# Patient Record
Sex: Male | Born: 1990 | Race: White | Hispanic: No | Marital: Single | Smoking: Current every day smoker
Health system: Southern US, Community
[De-identification: ages and names within clinical notes are randomized; demographics above are authoritative.]

## PROBLEM LIST (undated history)

## (undated) DIAGNOSIS — F419 Anxiety disorder, unspecified: Secondary | ICD-10-CM

## (undated) DIAGNOSIS — Z8659 Personal history of other mental and behavioral disorders: Principal | ICD-10-CM

## (undated) HISTORY — DX: Personal history of other mental and behavioral disorders: Z86.59

## (undated) HISTORY — DX: Anxiety disorder, unspecified: F41.9

## (undated) HISTORY — PX: TONSILLECTOMY: SUR1361

---
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
## 2012-06-15 ENCOUNTER — Other Ambulatory Visit: Payer: Self-pay

## 2012-06-15 MED ORDER — LISDEXAMFETAMINE DIMESYLATE 40 MG PO CAPS: 40.0000 mg | ORAL_CAPSULE | ORAL | Status: AC

## 2012-06-15 NOTE — Telephone Encounter (Signed)
Refill request for Vyvanse 40 mg. When it is ready mom wants a call so [facility] can pick it up.

## 2012-07-06 ENCOUNTER — Other Ambulatory Visit: Payer: Self-pay | Admitting: *Deleted

## 2012-07-06 MED ORDER — CLONIDINE HCL 0.1 MG PO TABS: 0.1000 mg | ORAL_TABLET | Freq: Every day | ORAL | Status: DC

## 2012-07-06 NOTE — Telephone Encounter (Signed)
Pt needs an office visit before any more refills will be given.

## 2012-07-17 ENCOUNTER — Other Ambulatory Visit: Payer: Self-pay | Admitting: *Deleted

## 2012-07-17 NOTE — Telephone Encounter (Signed)
Patient needs an appt before any refills can be given.  He was last seen 02/09/2012,

## 2012-07-25 ENCOUNTER — Ambulatory Visit (INDEPENDENT_AMBULATORY_CARE_PROVIDER_SITE_OTHER): Payer: Self-pay | Admitting: Pediatrics

## 2012-07-25 ENCOUNTER — Encounter: Payer: Self-pay | Admitting: Pediatrics

## 2012-07-25 VITALS — BP 118/68 | Temp 99.0°F | Wt 136.2 lb

## 2012-07-25 DIAGNOSIS — F909 Attention-deficit hyperactivity disorder, unspecified type: Secondary | ICD-10-CM

## 2012-07-25 NOTE — Progress Notes (Deleted)
Subjective:     Patient ID: Henry Reynolds, male   DOB: [birth year], 21 y.o.   MRN: [id number]  HPI: ***   ROS:  Apart from the symptoms reviewed above, there are no other symptoms referable to all systems reviewed.   Physical Examination  Blood pressure 118/68, temperature 99 F (37.2 C), temperature source Temporal, weight 136 lb 4 oz (61.803 kg). General: Alert, NAD HEENT: TM's - clear, Throat - clear, Neck - FROM, no meningismus, Sclera - clear LYMPH NODES: No LN noted LUNGS: CTA B CV: RRR without Murmurs ABD: Soft, NT, +BS, No HSM GU: Not Examined SKIN: Clear, No rashes noted NEUROLOGICAL: Grossly intact MUSCULOSKELETAL: Not examined  No results found. No results found for this or any previous visit (from the past 240 hour(s)). No results found for this or any previous visit (from the past 48 hour(s)).  Assessment:   ***  Plan:   ***

## 2012-07-25 NOTE — Progress Notes (Addendum)
Patient ID: Henry Reynolds, male   DOB: [birth year], 22 y.o.   MRN: [id number] Subjective:     Patient ID: Henry Reynolds, male   DOB: [birth year], 22 y.o.   MRN: [id number]  [id number] here for refill on ADHD medications. Patient states that the vyvanse calms him down. He has anger outbursts and makes it better. Patient does smoke one cig every 2 hours. Patient does not go to work. He states he is keeping his "options open" . He wants to go to get a degree in computer. Denies any side effects from the medication.   ROS:  Apart from the symptoms reviewed above, there are no other symptoms referable to all systems reviewed.   Physical Examination  Blood pressure 118/68, temperature 99 F (37.2 C), temperature source Temporal, weight 136 lb 4 oz (61.803 kg).  HR - 120 General: Alert, NAD, anxious appearing male. HEENT: TM's - clear, Throat - clear, Neck - FROM, no meningismus, Sclera - clear LYMPH NODES: No LN noted LUNGS: CTA B CV: RRR without Murmurs ABD: Soft, NT, +BS, No HSM GU: Not Examined SKIN: Clear, No rashes noted NEUROLOGICAL: Grossly intact MUSCULOSKELETAL: Not examined  No results found. No results found for this or any previous visit (from the past 240 hour(s)). No results found for this or any previous visit (from the past 48 hour(s)).  Assessment:   adhd ? Anxiety - worried about his mother who is having back issues. Elevated HR  Plan:   Current Outpatient Prescriptions  Medication Sig Dispense Refill  . cloNIDine (CATAPRES) 0.1 MG tablet Take 1 tablet (0.1 mg total) by mouth at bedtime.  30 tablet  0  . lisdexamfetamine (VYVANSE) 40 MG capsule Take 1 capsule (40 mg total) by mouth every morning.  30 capsule  0   No current facility-administered medications for this visit.   No refills on the medications. Need to come back tomorrow to recheck HR. Needs to make sure does not take any caffeine products.  After the visit, spoke with the father for 15 minutes in  regards to why needed to see patient back. Father states that the patient has "ADHD" , but not sure about it. The patient has a tendency to keep things in. Will not discuss things with the father. The father himself has anxiety and the grandfather committed suicide. He feels that the patient needs to see a psych. And a therapist for the issues he is having, but the patient refuses to see on. He was on an anti depressant in the past, but caused him to gain a lot of weight and he refuses to go back on it. The patient did want to go for computer classes, but he does not drive or have a car, so the father will have to transport him. Father states that first of all, he can not afford to pay for all of his schooling and second of all, the classes were scheduled during work hours and the father could not afford to take time off of work to drive him 30 minutes away.

## 2012-07-26 ENCOUNTER — Ambulatory Visit: Payer: Self-pay | Admitting: Pediatrics

## 2012-07-27 ENCOUNTER — Ambulatory Visit (INDEPENDENT_AMBULATORY_CARE_PROVIDER_SITE_OTHER): Payer: Self-pay | Admitting: Pediatrics

## 2012-07-27 VITALS — BP 110/58 | HR 88 | Temp 99.3°F | Wt 135.1 lb

## 2012-07-27 DIAGNOSIS — Z8659 Personal history of other mental and behavioral disorders: Secondary | ICD-10-CM

## 2012-07-28 ENCOUNTER — Encounter: Payer: Self-pay | Admitting: Pediatrics

## 2012-07-28 DIAGNOSIS — F419 Anxiety disorder, unspecified: Secondary | ICD-10-CM

## 2012-07-28 DIAGNOSIS — Z8659 Personal history of other mental and behavioral disorders: Secondary | ICD-10-CM

## 2012-07-28 HISTORY — DX: Anxiety disorder, unspecified: F41.9

## 2012-07-28 HISTORY — DX: Personal history of other mental and behavioral disorders: Z86.59

## 2012-07-28 NOTE — Addendum Note (Signed)
Addended by: Martyn Ehrich A on: 07/28/2012 03:53 PM   Modules accepted: Level of Service

## 2012-07-28 NOTE — Progress Notes (Signed)
Patient ID: SEBERT STOLLINGS, male   DOB: 16-Apr-1990, 21 y.o.   MRN: [id number]  Subjective:     Patient ID: Henry Reynolds, male   DOB: 1990-08-10, 21 y.o.   MRN: [id number]  HPI: Pt is here with dad today. They want to discuss his ADHD meds. The pt was seen 2 days ago for a f/u visit and pulse was 125. The medication was not refilled. This was explained to dad over the phone. The pt was instructed to return to recheck Pulse and BP yesterday, but he took his last pill of Vyvanse yesterday instead. Mom called and left a very panicked voicemail about fear that he would go into withdrawal or "die" if he stops the medicine suddenly. The pt has been taking the medicine daily for about 3-4 years without missing it. So far pulse and BP have been wnl. He admits he has caffeine almost daily and had a dr pepper before the visit 2 days ago. He also smokes about 10-12 cigarettes a day. The pt has a h/o non specific anxiety and says he had been worried about his mom having recent back surgery.  Dad measured the pulse all day yesterday. He says it was 100s in the am and went down to 80s in the evening. He says coming to the doctor makes him nervous and it goes up. There is no h/o sob, dizziness or syncope.  The pt had some attention issues in school and was started on Vyvanse. He remained at 40 mg for many years and it helped at school. He has been out of school for 2 years now. Dad and family members seem to be confused about the use of the Vyvanse and think it is for mood. The pt says he no longer feels he has focus issues. He does not work. He stays at home and plays on the computer. He is interested in software and works on some personal projects. He does some yardwork occasionally. He admits he does not have any friends his own age and very little social life. He denies any distress about this. He denies any feelings of depression, anger, anxiety. He denies having any panic attacks. There are no behavior issues at home.  His dad and sister are on antidepressants, but there is no unusual stressor in his environment. There has been no change in appetite or weight. The pt sleeps late at night and wakes up around noon. Takes Vyvanse at that time. He takes 0.1 mg of Clonidine before sleep. He forgets sometimes and has no trouble sleeping. The pt had been on Symbyax 6/25 over 2 years ago for unclear reasons. It made him gain weight and he stopped it. Weight had gone up to 203 lbs. He has never tried any other "mood" medicines as far as he can remember.   The pt has a h/o asthma and AR. He rarely uses his inhaler. He has not had a refill in many years. He says he used an old one last week after working outdoors, for wheezing. He takes Claritin on and off.    ROS:  Apart from the symptoms reviewed above, there are no other symptoms referable to all systems reviewed.   Physical Examination  Blood pressure 110/58, pulse 88, temperature 99.3 F (37.4 C), temperature source Temporal, weight 135 lb 2 oz (61.292 kg). General: Alert, NAD. Very quiet and reserved. Polite. Appropriate affect.  HEENT: TM's - clear, Throat - clear, Neck - FROM, no meningismus, Sclera - clear  LYMPH NODES: No LN noted LUNGS: CTA B CV: RRR without Murmurs  No results found. No results found for this or any previous visit (from the past 240 hour(s)). No results found for this or any previous visit (from the past 48 hour(s)).  Assessment:   Discussion with pt and dad regarding fears of stopping Vyvanse. I told them that some pts skip it over the weekends and summer break, and some pts miss doses entirely for a few days. His pulse and BP are wnl today, but there is no need for ADD meds at this time.  It appears the pt had very mild ADD issues to begin with and was on a relatively low, unchanged dose for many years.  There is a h/o anxiety with a strong family h/o mood disorders: GF committed suicide. 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  Plan:   Strongly reassured dad that there is no harm in stopping Vyvanse. Eventually stop Clonidine soon also. The pt declines the need for antidepressants or anxiolytics at this time. Encouraged the pt to pursue a degree in software or get another job. He needs to associate wiith like-minded, similar-aged people and enhance his social life in order to avoid depression. Strongly discouraged smoking, but pt says he does not want to stop at this time.  Pt and dad agree with this plan and feel safe stopping the Vyvanse. Should the pt start classes or work and find ADD symptoms arising we can start meds again. Also discussed with the pt that should he develop anxiety or depression he is welcome to come back and discuss these issues alone and in confidentiality. RTC PRN. Needs a physical.

## 2012-07-31 ENCOUNTER — Other Ambulatory Visit: Payer: Self-pay | Admitting: Pediatrics

## 2012-08-30 ENCOUNTER — Other Ambulatory Visit: Payer: Self-pay | Admitting: Pediatrics

## 2012-10-24 ENCOUNTER — Other Ambulatory Visit: Payer: Self-pay | Admitting: *Deleted

## 2012-10-24 ENCOUNTER — Telehealth: Payer: Self-pay | Admitting: *Deleted

## 2012-10-24 ENCOUNTER — Other Ambulatory Visit: Payer: Self-pay | Admitting: Pediatrics

## 2012-10-24 MED ORDER — LORATADINE 10 MG PO TABS: ORAL_TABLET | ORAL | Status: AC

## 2012-10-24 NOTE — Telephone Encounter (Signed)
Nurse called to speak with pt, no answer, no answering machine and was not able to leave VM

## 2012-10-24 NOTE — Telephone Encounter (Signed)
Mom called and left VM on nurse line stating that pharmacy informed her that refills were refused and she wants to know why. Will route to MD

## 2012-10-24 NOTE — Telephone Encounter (Signed)
We had stopped his Vyvanse and Clonidine a few months ago. The pt stated that he no longer needed the Clonidine to sleep since he was off the vyvanse. If he has insomnia issues, then we need to refer him to a specialist.

## 2012-10-25 ENCOUNTER — Telehealth: Payer: Self-pay | Admitting: *Deleted

## 2012-10-25 ENCOUNTER — Other Ambulatory Visit: Payer: Self-pay | Admitting: Pediatrics

## 2012-10-25 NOTE — Telephone Encounter (Signed)
Nurse called number available for pt and mom answered. Nurse asked for pt, mom stated that he was in the bed. Left message with mom to have pt return call.

## 2012-10-25 NOTE — Telephone Encounter (Signed)
See telephone encounters from today.

## 2012-10-25 NOTE — Telephone Encounter (Signed)
Mom called and left VM on nurse line stating that nurse was waiting for pt to return call. She stated that he was up and nurse could speak with him now.  Nurse returned call and spoke with pt. Informed him that Vyvanse and Clonidine were stopped after last visit and that his allergy medication had been refilled and sent to pharmacy. Pt asked if he was having issues sleeping and he stated no that he was not having issues sleeping. Pt understanding.

## 2016-01-07 ENCOUNTER — Emergency Department (HOSPITAL_COMMUNITY)
Admission: EM | Admit: 2016-01-07 | Discharge: 2016-01-07 | Disposition: A | Discharge status: Home / Self Care | Payer: Self-pay | Attending: Emergency Medicine | Admitting: Emergency Medicine

## 2016-01-07 ENCOUNTER — Encounter (HOSPITAL_COMMUNITY): Payer: Self-pay | Admitting: Emergency Medicine

## 2016-01-07 ENCOUNTER — Emergency Department (HOSPITAL_COMMUNITY): Payer: Self-pay

## 2016-01-07 DIAGNOSIS — R079 Chest pain, unspecified: Secondary | ICD-10-CM | POA: Insufficient documentation

## 2016-01-07 DIAGNOSIS — F17200 Nicotine dependence, unspecified, uncomplicated: Secondary | ICD-10-CM | POA: Insufficient documentation

## 2016-01-07 DIAGNOSIS — F909 Attention-deficit hyperactivity disorder, unspecified type: Secondary | ICD-10-CM | POA: Insufficient documentation

## 2016-01-07 LAB — CBC WITH DIFFERENTIAL/PLATELET
BASOS ABS: 0 10*3/uL (ref 0.0–0.1)
BASOS PCT: 1 %
EOS ABS: 0.1 10*3/uL (ref 0.0–0.7)
EOS PCT: 3 %
HCT: 42.7 % (ref 39.0–52.0)
Hemoglobin: 15.7 g/dL (ref 13.0–17.0)
Lymphocytes Relative: 49 %
Lymphs Abs: 1.9 10*3/uL (ref 0.7–4.0)
MCH: 32 pg (ref 26.0–34.0)
MCHC: 36.8 g/dL — AB (ref 30.0–36.0)
MCV: 87.1 fL (ref 78.0–100.0)
MONO ABS: 0.2 10*3/uL (ref 0.1–1.0)
MONOS PCT: 5 %
NEUTROS ABS: 1.6 10*3/uL — AB (ref 1.7–7.7)
Neutrophils Relative %: 42 %
PLATELETS: 300 10*3/uL (ref 150–400)
RBC: 4.9 MIL/uL (ref 4.22–5.81)
RDW: 11.7 % (ref 11.5–15.5)
WBC: 3.8 10*3/uL — ABNORMAL LOW (ref 4.0–10.5)

## 2016-01-07 LAB — BASIC METABOLIC PANEL
ANION GAP: 6 (ref 5–15)
BUN: 11 mg/dL (ref 6–20)
CALCIUM: 9.4 mg/dL (ref 8.9–10.3)
CO2: 27 mmol/L (ref 22–32)
CREATININE: 0.99 mg/dL (ref 0.61–1.24)
Chloride: 103 mmol/L (ref 101–111)
Glucose, Bld: 101 mg/dL — ABNORMAL HIGH (ref 65–99)
Potassium: 3.7 mmol/L (ref 3.5–5.1)
SODIUM: 136 mmol/L (ref 135–145)

## 2016-01-07 LAB — TROPONIN I

## 2016-01-07 NOTE — Discharge Instructions (Signed)
Take Tylenol for pain.  Use heat on the sore area 3-4 times a day.  Try to talk with family and friends, when you are feeling stressed.

## 2016-01-07 NOTE — ED Triage Notes (Signed)
Patient complaining of epigastric pain starting approximately one hour prior to arrival to ER. States pain is sharp and radiated into abdomen.

## 2016-01-07 NOTE — ED Provider Notes (Signed)
[facility]-EMERGENCY DEPT Provider Note   CSN: [id number] Arrival date & time: 01/07/16  1344     History   Chief Complaint Chief Complaint  Patient presents with  . Chest Pain    HPI Henry Reynolds is a 25 y.o. male.  He reports onset of left anterior chest pain, which started spontaneously, 2 hours ago. Pain is intermittent. The pain is notable when he takes a deep breath, and stands up straight, normal walking. He denies fever, chills, cough, shortness of breath, nausea, vomiting, anorexia or change in bowel and urinary habits. No similar problem in the past. He is under stress because of his mother's recent diagnosis of diabetes and his grandmother's recent surgery on her back. Also frustrated that he is unable to help his grandmother with her problem. There are no other known modifying factors.  HPI  Past Medical History:  Diagnosis Date  . Anxiety, mild 07/28/2012  . History of ADHD 07/28/2012    Patient Active Problem List   Diagnosis Date Noted  . History of ADHD 07/28/2012  . Anxiety, mild 07/28/2012    Past Surgical History:  Procedure Laterality Date  . TONSILLECTOMY         Home Medications    Prior to Admission medications   Medication Sig Start Date End Date Taking? Authorizing Provider  cloNIDine (CATAPRES) 0.1 MG tablet TAKE ONE TABLET BY MOUTH AT BEDTIME. 07/31/12   Laurell Josephsalia A Khalifa, MD  lisdexamfetamine (VYVANSE) 40 MG capsule Take 1 capsule (40 mg total) by mouth every morning. 06/15/12   Laurell Josephsalia A Khalifa, MD  loratadine (CLARITIN) 10 MG tablet TAKE ONE TABLET DAILY FOR ALLERGIES. 10/24/12   Laurell Josephsalia A Khalifa, MD    Family History Family History  Problem Relation Age of Onset  . Mental illness Paternal Grandfather     committed suicide  . Mental illness Father     anxiety    Social History Social History  Substance Use Topics  . Smoking status: Current Every Day Smoker  . Smokeless tobacco: Never Used  . Alcohol use Yes     Comment: occasionally      Allergies   Cephalexin and Codeine   Review of Systems Review of Systems  All other systems reviewed and are negative.    Physical Exam Updated Vital Signs BP (!) 149/108 (BP Location: Right Arm)   Pulse 115   Temp 98.2 F (36.8 C) (Oral)   Resp 16   Ht 5\' 7"  (1.702 m)   Wt 140 lb (63.5 kg)   SpO2 100%   BMI 21.93 kg/m   Physical Exam  Constitutional: He is oriented to person, place, and time. He appears well-developed and well-nourished.  HENT:  Head: Normocephalic and atraumatic.  Right Ear: External ear normal.  Left Ear: External ear normal.  Eyes: Conjunctivae and EOM are normal. Pupils are equal, round, and reactive to light.  Neck: Normal range of motion and phonation normal. Neck supple.  Cardiovascular: Normal rate, regular rhythm and normal heart sounds.   Pulmonary/Chest: Effort normal and breath sounds normal. He exhibits tenderness (Left anterior, mild, no associated crepitation). He exhibits no bony tenderness.  Abdominal: Soft. There is no tenderness.  Musculoskeletal: Normal range of motion.  Neurological: He is alert and oriented to person, place, and time. No cranial nerve deficit or sensory deficit. He exhibits normal muscle tone. Coordination normal.  Skin: Skin is warm, dry and intact.  Psychiatric: He has a normal mood and affect. His behavior is normal. Judgment  and thought content normal.  Nursing note and vitals reviewed.    ED Treatments / Results  Labs (all labs ordered are listed, but only abnormal results are displayed) Labs Reviewed  CBC WITH DIFFERENTIAL/PLATELET - Abnormal; Notable for the following:       Result Value   WBC 3.8 (*)    MCHC 36.8 (*)    Neutro Abs 1.6 (*)    All other components within normal limits  BASIC METABOLIC PANEL - Abnormal; Notable for the following:    Glucose, Bld 101 (*)    All other components within normal limits  TROPONIN I    EKG  EKG Interpretation  Date/Time:  Wednesday January 07 2016 13:53:45 EDT Ventricular Rate:  100 PR Interval:  132 QRS Duration: 88 QT Interval:  350 QTC Calculation: 451 R Axis:   74 Text Interpretation:  Normal sinus rhythm Normal ECG Since last tracing of earlier today arm lead reversal has been corrected Confirmed by Effie Shy  MD, Tylynn Braniff [phone]) on 01/07/2016 3:06:39 PM       Radiology Dg Chest 2 View  Result Date: 01/07/2016 CLINICAL DATA:  Mid chest pain off and on for 1 hour EXAM: CHEST  2 VIEW COMPARISON:  05/02/2007 FINDINGS: The heart size and mediastinal contours are within normal limits. Both lungs are clear. The visualized skeletal structures are unremarkable. IMPRESSION: No active cardiopulmonary disease. Electronically Signed   By: Alcide Clever M.D.   On: 01/07/2016 14:10    Procedures Procedures (including critical care time)  Medications Ordered in ED Medications - No data to display   Initial Impression / Assessment and Plan / ED Course  I have reviewed the triage vital signs and the nursing notes.  Pertinent labs & imaging results that were available during my care of the patient were reviewed by me and considered in my medical decision making (see chart for details).  Clinical Course    Medications - No data to display  Patient Vitals for the past 24 hrs:  BP Temp Temp src Pulse Resp SpO2 Height Weight  01/07/16 1348 (!) 149/108 98.2 F (36.8 C) Oral 115 16 100 % 5\' 7"  (1.702 m) 140 lb (63.5 kg)    3:33 PM Reevaluation with update and discussion. After initial assessment and treatment, an updated evaluation reveals No change in clinical status. Findings discussed with patient and family members, all questions answered. Alazia Crocket L    Final Clinical Impressions(s) / ED Diagnoses   Final diagnoses:  Nonspecific chest pain    Nonspecific chest pain, with fissuring evaluation. Doubt ACS, PE, pneumonia or pericarditis.  Nursing Notes Reviewed/ Care Coordinated Applicable Imaging  Reviewed Interpretation of Laboratory Data incorporated into ED treatment  The patient appears reasonably screened and/or stabilized for discharge and I doubt any other medical condition or other [facility] requiring further screening, evaluation, or treatment in the ED at this time prior to discharge.  Plan: Home Medications- APAP; Home Treatments- heat; return here if the recommended treatment, does not improve the symptoms; Recommended follow up- PCP prn   New Prescriptions New Prescriptions   No medications on file     Mancel Bale, MD 01/07/16 1534
